# Patient Record
Sex: Female | Born: 1995 | Race: Black or African American | Hispanic: No | Marital: Single | Smoking: Never smoker
Health system: Southern US, Community
[De-identification: ages and names within clinical notes are randomized; demographics above are authoritative.]

---
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
## 2017-04-21 ENCOUNTER — Emergency Department (HOSPITAL_COMMUNITY)
Admission: EM | Admit: 2017-04-21 | Discharge: 2017-04-22 | Disposition: A | Discharge status: Home / Self Care | Payer: Managed Care, Other (non HMO) | Attending: Emergency Medicine | Admitting: Emergency Medicine

## 2017-04-21 ENCOUNTER — Emergency Department (HOSPITAL_COMMUNITY): Payer: Managed Care, Other (non HMO)

## 2017-04-21 ENCOUNTER — Encounter (HOSPITAL_COMMUNITY): Payer: Self-pay

## 2017-04-21 DIAGNOSIS — R197 Diarrhea, unspecified: Secondary | ICD-10-CM | POA: Insufficient documentation

## 2017-04-21 DIAGNOSIS — R112 Nausea with vomiting, unspecified: Secondary | ICD-10-CM | POA: Diagnosis not present

## 2017-04-21 DIAGNOSIS — F419 Anxiety disorder, unspecified: Secondary | ICD-10-CM | POA: Insufficient documentation

## 2017-04-21 DIAGNOSIS — R079 Chest pain, unspecified: Secondary | ICD-10-CM

## 2017-04-21 DIAGNOSIS — R0602 Shortness of breath: Secondary | ICD-10-CM | POA: Diagnosis present

## 2017-04-21 LAB — COMPREHENSIVE METABOLIC PANEL
ALBUMIN: 4.3 g/dL (ref 3.5–5.0)
ALT: 16 U/L (ref 14–54)
ANION GAP: 11 (ref 5–15)
AST: 21 U/L (ref 15–41)
Alkaline Phosphatase: 75 U/L (ref 38–126)
BUN: 13 mg/dL (ref 6–20)
CHLORIDE: 105 mmol/L (ref 101–111)
CO2: 22 mmol/L (ref 22–32)
Calcium: 9.8 mg/dL (ref 8.9–10.3)
Creatinine, Ser: 0.71 mg/dL (ref 0.44–1.00)
GFR calc non Af Amer: >60 mL/min (ref >60)
Glucose, Bld: 99 mg/dL (ref 65–99)
Potassium: 4 mmol/L (ref 3.5–5.1)
SODIUM: 138 mmol/L (ref 135–145)
Total Bilirubin: 0.4 mg/dL (ref 0.3–1.2)
Total Protein: 8 g/dL (ref 6.5–8.1)

## 2017-04-21 LAB — URINALYSIS, ROUTINE W REFLEX MICROSCOPIC
Bilirubin Urine: NEGATIVE
Glucose, UA: NEGATIVE mg/dL
Ketones, ur: 80 mg/dL — AB
Leukocytes, UA: NEGATIVE
NITRITE: NEGATIVE
PROTEIN: 100 mg/dL — AB
Specific Gravity, Urine: 1.027 (ref 1.005–1.030)
pH: 6 (ref 5.0–8.0)

## 2017-04-21 LAB — CBC
HCT: 30.1 % — ABNORMAL LOW (ref 36.0–46.0)
HEMOGLOBIN: 9.4 g/dL — AB (ref 12.0–15.0)
MCH: 23.5 pg — AB (ref 26.0–34.0)
MCHC: 31.2 g/dL (ref 30.0–36.0)
MCV: 75.3 fL — AB (ref 78.0–100.0)
Platelets: 326 10*3/uL (ref 150–400)
RBC: 4 MIL/uL (ref 3.87–5.11)
RDW: 17.8 % — ABNORMAL HIGH (ref 11.5–15.5)
WBC: 6.7 10*3/uL (ref 4.0–10.5)

## 2017-04-21 LAB — LIPASE, BLOOD: LIPASE: 26 U/L (ref 11–51)

## 2017-04-21 LAB — I-STAT BETA HCG BLOOD, ED (MC, WL, AP ONLY)

## 2017-04-21 MED ORDER — PROCHLORPERAZINE EDISYLATE 5 MG/ML IJ SOLN
10.0000 mg | Freq: Once | INTRAMUSCULAR | Status: AC
  Administered 2017-04-21: 10 mg via INTRAVENOUS
  Filled 2017-04-21: qty 2
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
## 2017-04-21 NOTE — ED Notes (Signed)
Med given 

## 2017-04-21 NOTE — ED Provider Notes (Signed)
[facility] EMERGENCY DEPARTMENT Provider Note   CSN: [id number] Arrival date & time: 04/21/17  1840     History   Chief Complaint Chief Complaint  Patient presents with  . CP, SOB, abd. pain   HPI  Blood pressure (!) 145/97, pulse (!) 57, temperature 98.9 F (37.2 C), temperature source Oral, resp. rate 17, SpO2 100 %.  Kina Shiffman is a 21 y.o. female complaining of nausea vomiting diarrhea with epigastric abdominal pain she has shortness of breath, states that it may be secondary to anxiety, hyperventilating initially on arrival this is she has been taking unknown antibiotic but she has been unable to keep this down.  She does not report any significant flank pain or fever chills or sick contacts. . Patient with no history of DVT/PE, recent immobilizations, calf pain, leg swelling.  Does not take any hormonal birth control.  History reviewed. No pertinent past medical history.  There are no active problems to display for this patient.   History reviewed. No pertinent surgical history.  OB History    No data available       Home Medications    Prior to Admission medications   Medication Sig Start Date End Date Taking? Authorizing Provider  hydrOXYzine (ATARAX/VISTARIL) 25 MG tablet Take 1 tablet (25 mg total) by mouth every 6 (six) hours. 04/22/17   Efton Thomley, Joni Reining, PA-C  ibuprofen (ADVIL,MOTRIN) 800 MG tablet Take 1 tablet (800 mg total) by mouth 3 (three) times daily. 04/29/15   Fayrene Helper, PA-C  methocarbamol (ROBAXIN) 500 MG tablet Take 1 tablet (500 mg total) by mouth 2 (two) times daily. 04/29/15   Fayrene Helper, PA-C  prochlorperazine (COMPAZINE) 10 MG tablet Take 1 tablet (10 mg total) by mouth 2 (two) times daily as needed for nausea or vomiting (Nausea ). 04/22/17   Cherylene Ferrufino, Mardella Layman    Family History No family history on file.  Social History Social History  Substance Use Topics  . Smoking status: Never Smoker  . Smokeless  tobacco: Not on file  . Alcohol use No     Allergies   Patient has no known allergies.   Review of Systems Review of Systems  A complete review of systems was obtained and all systems are negative except as noted in the HPI and PMH.   Physical Exam Updated Vital Signs BP (!) 145/97   Pulse (!) 57   Temp 98.9 F (37.2 C) (Oral)   Resp 17   SpO2 100%   Physical Exam  Constitutional: She is oriented to person, place, and time. She appears well-developed and well-nourished. No distress.  HENT:  Head: Normocephalic and atraumatic.  Mouth/Throat: Oropharynx is clear and moist.  Eyes: Pupils are equal, round, and reactive to light. Conjunctivae and EOM are normal.  Neck: Normal range of motion. No JVD present. No tracheal deviation present.  Cardiovascular: Normal rate, regular rhythm and intact distal pulses.   Radial pulse equal bilaterally  Pulmonary/Chest: Effort normal and breath sounds normal. No stridor. No respiratory distress. She has no wheezes. She has no rales. She exhibits no tenderness.  Abdominal: Soft. She exhibits no distension and no mass. There is no tenderness. There is no rebound and no guarding.  Musculoskeletal: Normal range of motion. She exhibits no edema or tenderness.  No calf asymmetry, superficial collaterals, palpable cords, edema, Homans sign negative bilaterally.    Neurological: She is alert and oriented to person, place, and time.  Skin: Skin is warm. She is not  diaphoretic.  Psychiatric: She has a normal mood and affect.  Nursing note and vitals reviewed.    ED Treatments / Results  Labs (all labs ordered are listed, but only abnormal results are displayed) Labs Reviewed  CBC - Abnormal; Notable for the following:       Result Value   Hemoglobin 9.4 (*)    HCT 30.1 (*)    MCV 75.3 (*)    MCH 23.5 (*)    RDW 17.8 (*)    All other components within normal limits  URINALYSIS, ROUTINE W REFLEX MICROSCOPIC - Abnormal; Notable for the  following:    APPearance HAZY (*)    Hgb urine dipstick SMALL (*)    Ketones, ur 80 (*)    Protein, ur 100 (*)    Bacteria, UA RARE (*)    Squamous Epithelial / LPF 0-5 (*)    All other components within normal limits  LIPASE, BLOOD  COMPREHENSIVE METABOLIC PANEL  I-STAT BETA HCG BLOOD, ED ([facility], [facility], [facility] ONLY)    EKG  EKG Interpretation None       Radiology Dg Abd Acute W/chest  Result Date: 04/21/2017 CLINICAL DATA:  Abdominal pain with vomiting EXAM: DG ABDOMEN ACUTE W/ 1V CHEST COMPARISON:  None. FINDINGS: There is no evidence of dilated bowel loops or free intraperitoneal air. No radiopaque calculi or other significant radiographic abnormality is seen. Heart size and mediastinal contours are within normal limits. Both lungs are clear. Scoliosis of the spine. Nipple and navel rings. Probable calcified phleboliths in the left pelvis. IMPRESSION: Negative abdominal radiographs.  No acute cardiopulmonary disease. Electronically Signed   By: Jasmine Pang M.D.   On: 04/21/2017 23:07    Procedures Procedures (including critical care time)  Medications Ordered in ED Medications  prochlorperazine (COMPAZINE) injection 10 mg (10 mg Intravenous Given 04/21/17 2347)     Initial Impression / Assessment and Plan / ED Course  I have reviewed the triage vital signs and the nursing notes.  Pertinent labs & imaging results that were available during my care of the patient were reviewed by me and considered in my medical decision making (see chart for details).     Vitals:   04/21/17 2140 04/21/17 2200 04/21/17 2230 04/21/17 2300  BP: (!) 155/82 139/89 (!) 159/95 (!) 145/97  Pulse: (!) 59 (!) 52 (!) 53 (!) 57  Resp: 14 18 20 17   Temp: 98.9 F (37.2 C)     TempSrc: Oral     SpO2: 100% 100% 100% 100%    Medications  prochlorperazine (COMPAZINE) injection 10 mg (10 mg Intravenous Given 04/21/17 2347)    Kirstine Jacquin is 21 y.o. female presenting with nausea vomiting diarrhea  chest pain and shortness of breath.  Abdominal exam is benign.  This is likely a viral gastroenteritis with superimposed anxiety.  Blood work was reassuring.  Acute abdominal series normal.  Abdominal exam is benign, she is tolerating p.o.'s.  Will give Compazine for nausea and Atarax for anxiety.  Evaluation does not show pathology that would require ongoing emergent intervention or inpatient treatment. Pt is hemodynamically stable and mentating appropriately. Discussed findings and plan with patient/guardian, who agrees with care plan. All questions answered. Return precautions discussed and outpatient follow up given.    Final Clinical Impressions(s) / ED Diagnoses   Final diagnoses:  SOB (shortness of breath)  Nausea vomiting and diarrhea  Chest pain, unspecified type  Shortness of breath  Anxiety    New Prescriptions New Prescriptions  HYDROXYZINE (ATARAX/VISTARIL) 25 MG TABLET    Take 1 tablet (25 mg total) by mouth every 6 (six) hours.   PROCHLORPERAZINE (COMPAZINE) 10 MG TABLET    Take 1 tablet (10 mg total) by mouth 2 (two) times daily as needed for nausea or vomiting (Nausea ).     Kaylyn Limisciotta, Mette [place]gate, PA-C 04/22/17 [id number]    Doug SouJacubowitz, Sam, MD 04/25/17 [phone]

## 2017-04-21 NOTE — ED Triage Notes (Signed)
Patient complains of abd. Pain with vomiting that started early yesterday am. Now complains of CP and shortness of breath. Hyperventilating and anxious on arrival. States seen in [facility] yesterday and diagnosed with UTI. Hyperventilating improving with coaching

## 2017-04-22 ENCOUNTER — Telehealth: Payer: Self-pay | Admitting: *Deleted

## 2017-04-22 MED ORDER — HYDROXYZINE HCL 25 MG PO TABS: 25.0000 mg | ORAL_TABLET | Freq: Four times a day (QID) | ORAL | 0 refills | Status: AC

## 2017-04-22 MED ORDER — PROCHLORPERAZINE MALEATE 10 MG PO TABS
10.0000 mg | ORAL_TABLET | Freq: Two times a day (BID) | ORAL | 0 refills | Status: AC | PRN
Start: 2017-04-22 — End: ?

## 2017-04-22 NOTE — Telephone Encounter (Signed)
Pharmacy called related to Rx: Atarax and Compazine being prescribed at same time as they are in same class.  Pharmacy wanted to know name of attending to speak to .Marland Kitchen.Marland Kitchen.EDCM searched chart to find Courtney Pope was attending.  EDCM read note from PA that explained reasoning for medications (nausea and anxiety) and relayed to Pharmacy.

## 2017-04-22 NOTE — Discharge Instructions (Signed)
Do not hesitate to return to the emergency room for any new, worsening or concerning symptoms.  Please obtain primary care using resource guide below. Let them know that you were seen in the emergency room and that they will need to obtain records for further outpatient management.  Please follow with your primary care doctor in the next 5 days for high blood pressure evaluation. If you do not have a primary care doctor, present to urgent care. Reduce salt intake. Seek emergency medical care for unilateral weakness, slurring, change in vision, or chest pain and shortness of breath.

## 2017-07-29 ENCOUNTER — Other Ambulatory Visit: Payer: Self-pay

## 2017-07-29 ENCOUNTER — Ambulatory Visit (HOSPITAL_COMMUNITY)
Admission: EM | Admit: 2017-07-29 | Discharge: 2017-07-29 | Disposition: A | Discharge status: Home / Self Care | Payer: Managed Care, Other (non HMO) | Attending: Internal Medicine | Admitting: Internal Medicine

## 2017-07-29 ENCOUNTER — Encounter (HOSPITAL_COMMUNITY): Payer: Self-pay | Admitting: Emergency Medicine

## 2017-07-29 DIAGNOSIS — F12188 Cannabis abuse with other cannabis-induced disorder: Secondary | ICD-10-CM

## 2017-07-29 DIAGNOSIS — E860 Dehydration: Secondary | ICD-10-CM

## 2017-07-29 DIAGNOSIS — Z3202 Encounter for pregnancy test, result negative: Secondary | ICD-10-CM

## 2017-07-29 DIAGNOSIS — F12988 Cannabis use, unspecified with other cannabis-induced disorder: Principal | ICD-10-CM

## 2017-07-29 DIAGNOSIS — R112 Nausea with vomiting, unspecified: Secondary | ICD-10-CM | POA: Diagnosis not present

## 2017-07-29 DIAGNOSIS — F1290 Cannabis use, unspecified, uncomplicated: Secondary | ICD-10-CM

## 2017-07-29 LAB — POCT PREGNANCY, URINE: Preg Test, Ur: NEGATIVE

## 2017-07-29 MED ORDER — ONDANSETRON HCL 4 MG/2ML IJ SOLN: 4.0000 mg | Freq: Once | INTRAMUSCULAR | Status: DC

## 2017-07-29 MED ORDER — KETOROLAC TROMETHAMINE 60 MG/2ML IM SOLN
60.0000 mg | Freq: Once | INTRAMUSCULAR | Status: AC
  Administered 2017-07-29: 60 mg via INTRAMUSCULAR

## 2017-07-29 MED ORDER — ONDANSETRON HCL 4 MG/2ML IJ SOLN
4.0000 mg | Freq: Once | INTRAMUSCULAR | Status: AC
  Administered 2017-07-29: 4 mg via INTRAVENOUS

## 2017-07-29 MED ORDER — ONDANSETRON HCL 4 MG/2ML IJ SOLN
INTRAMUSCULAR | Status: AC
  Filled 2017-07-29: qty 2

## 2017-07-29 MED ORDER — ONDANSETRON 4 MG PO TBDP
ORAL_TABLET | ORAL | Status: AC
  Filled 2017-07-29: qty 1

## 2017-07-29 MED ORDER — SODIUM CHLORIDE 0.9 % IV SOLN
Freq: Once | INTRAVENOUS | Status: AC
  Administered 2017-07-29: 21:00:00 via INTRAVENOUS

## 2017-07-29 MED ORDER — ONDANSETRON 4 MG PO TBDP
4.0000 mg | ORAL_TABLET | Freq: Once | ORAL | Status: AC
  Administered 2017-07-29: 4 mg via ORAL

## 2017-07-29 MED ORDER — KETOROLAC TROMETHAMINE 60 MG/2ML IM SOLN
INTRAMUSCULAR | Status: AC
  Filled 2017-07-29: qty 2

## 2017-07-29 MED ORDER — ONDANSETRON 8 MG PO TBDP: 8.0000 mg | ORAL_TABLET | Freq: Three times a day (TID) | ORAL | 0 refills | Status: AC | PRN

## 2017-07-29 NOTE — ED Provider Notes (Addendum)
  MRN: [id number] DOB: 11/14/1995  Subjective:   Courtney Pope is a 22 y.o. female presenting for 2 day history of nausea and vomiting. Has tried Zofran but she is vomiting this as well. She has not been able to tolerate fluids or solids. Smokes marijuana 4 times weekly. Prior to smoking marijuana consistently, patient denies having cyclic vomiting episodes. Denies cigarettes smoking. Patient is sexually active, uses condoms for protection. She is currently on her cycle. Denies fever, dysuria, hematuria, genital rashes, cough.   Courtney Pope is not currently taking any medications and has No Known Allergies.  Courtney Pope denies past medical and surgical history.   Objective:   Vitals: BP (!) 152/89   Pulse 75   Temp 98.6 F (37 C)   Resp 18   LMP 07/29/2017   SpO2 100%   Physical Exam  Constitutional: She is oriented to person, place, and time. She appears well-developed and well-nourished.  HENT:  Mucous membranes dry.  Eyes: No scleral icterus.  Cardiovascular: Normal rate, regular rhythm and intact distal pulses. Exam reveals no gallop and no friction rub.  No murmur heard. Pulmonary/Chest: No respiratory distress. She has no wheezes. She has no rales.  Abdominal: Soft. Bowel sounds are normal. She exhibits no distension and no mass. There is tenderness (left-sided). There is no guarding.  Neurological: She is alert and oriented to person, place, and time.  Skin: Skin is warm and dry.   Urine pregnancy test was negative.   Assessment and Plan :   Cannabinoid hyperemesis syndrome (HCC)  Non-intractable vomiting with nausea, unspecified vomiting type  Dehydration  Marijuana use  Counseled on risks of using marijuana regularly. IV fluids administered. Patient provided script for Zofran. Return-to-clinic precautions discussed, patient verbalized understanding.    Wallis BambergMani, Jeanann Balinski, [place]PA-C 07/29/17 2152

## 2017-07-29 NOTE — ED Notes (Signed)
Patient states that she is feeling better. Denies any nausea at this time. Patient has called for ride home.

## 2017-07-29 NOTE — ED Triage Notes (Signed)
Pt states "I have cyclic vomiting syndrome and I was prescribed medicine but I cant keep it down." Pt was prescribed zofran.

## 2019-02-16 IMAGING — CR DG ABDOMEN ACUTE W/ 1V CHEST
3 series · 3 of 3 positions shown · non-contrast
Comparison: None.

CLINICAL DATA: Abdominal pain with vomiting

EXAM:
DG ABDOMEN ACUTE W/ 1V CHEST

[chest pa]
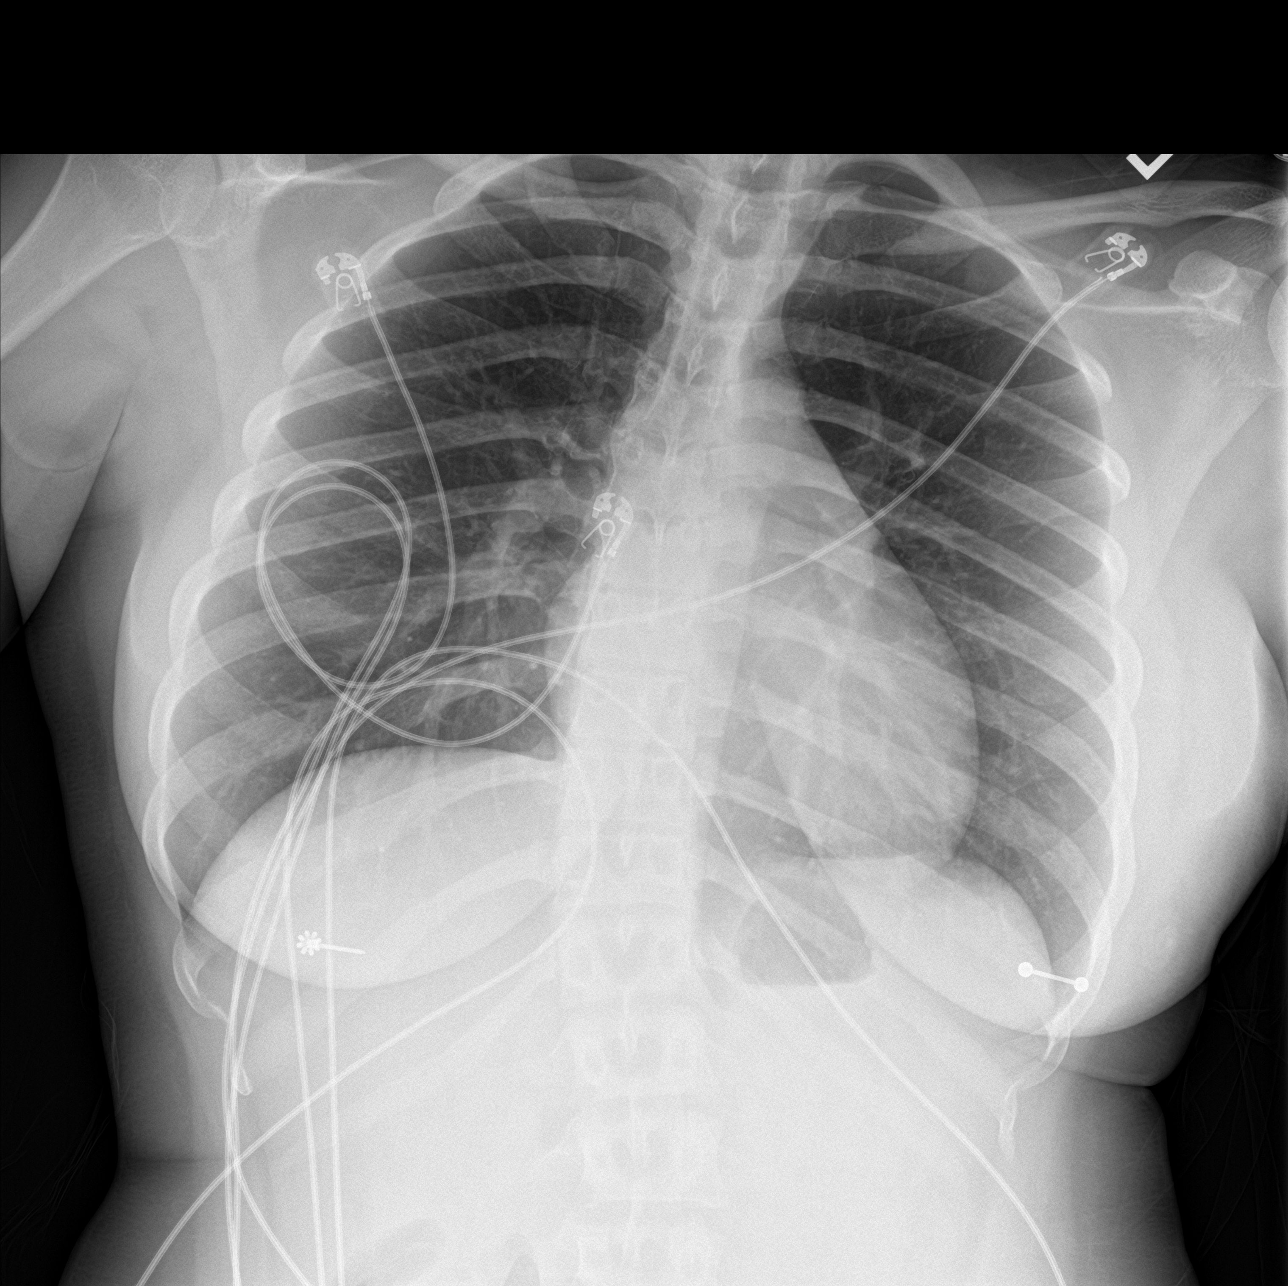

[abdomen erect]
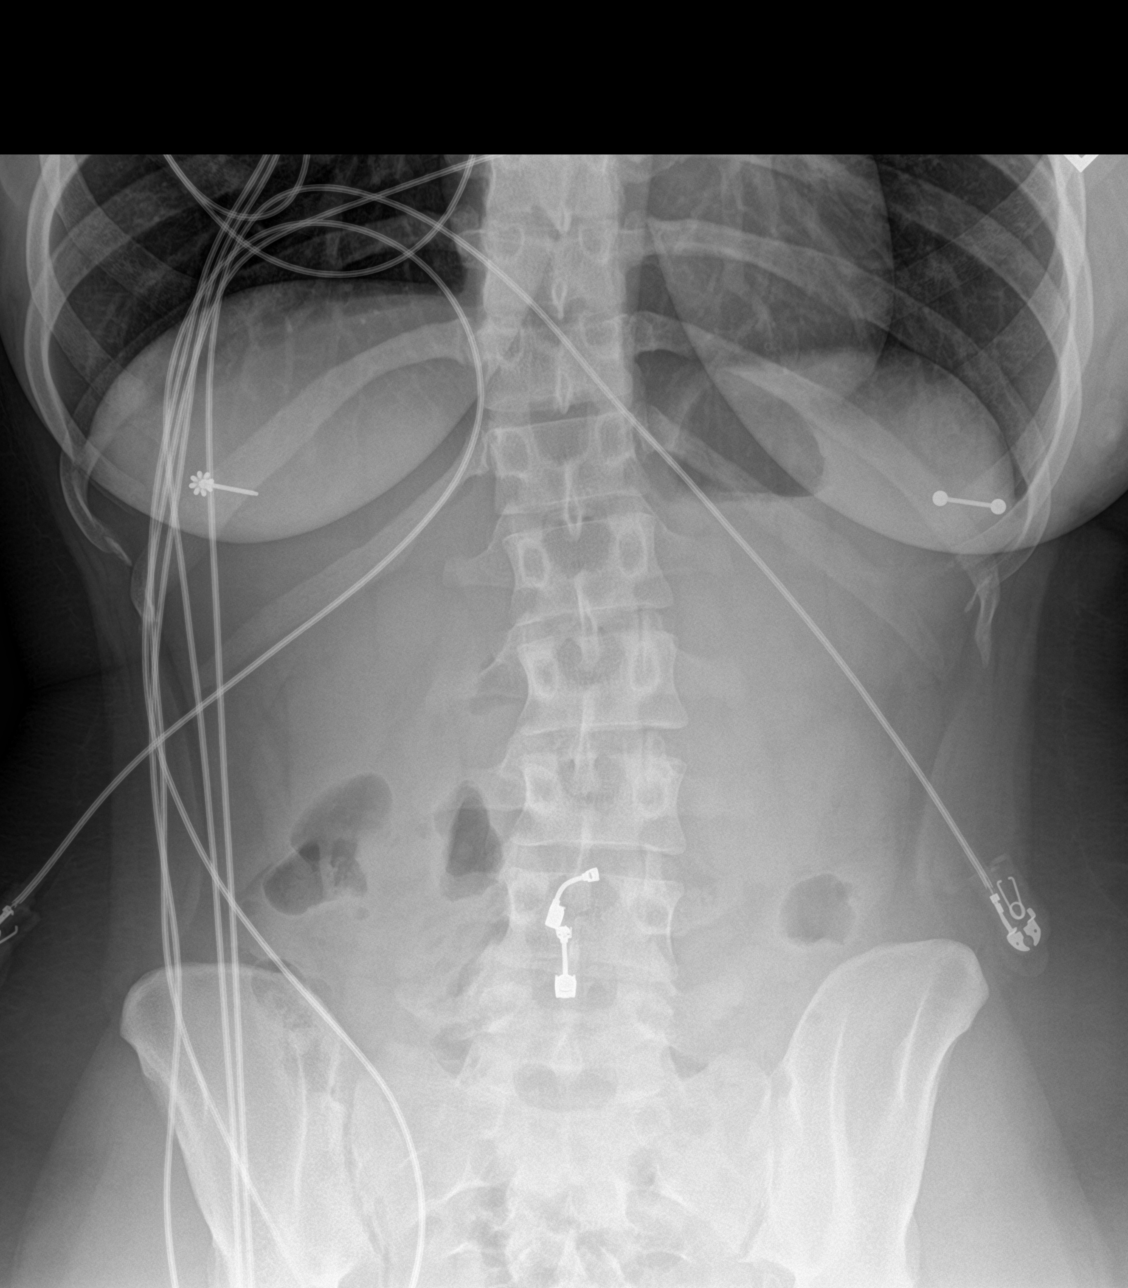

[abdomen supine]
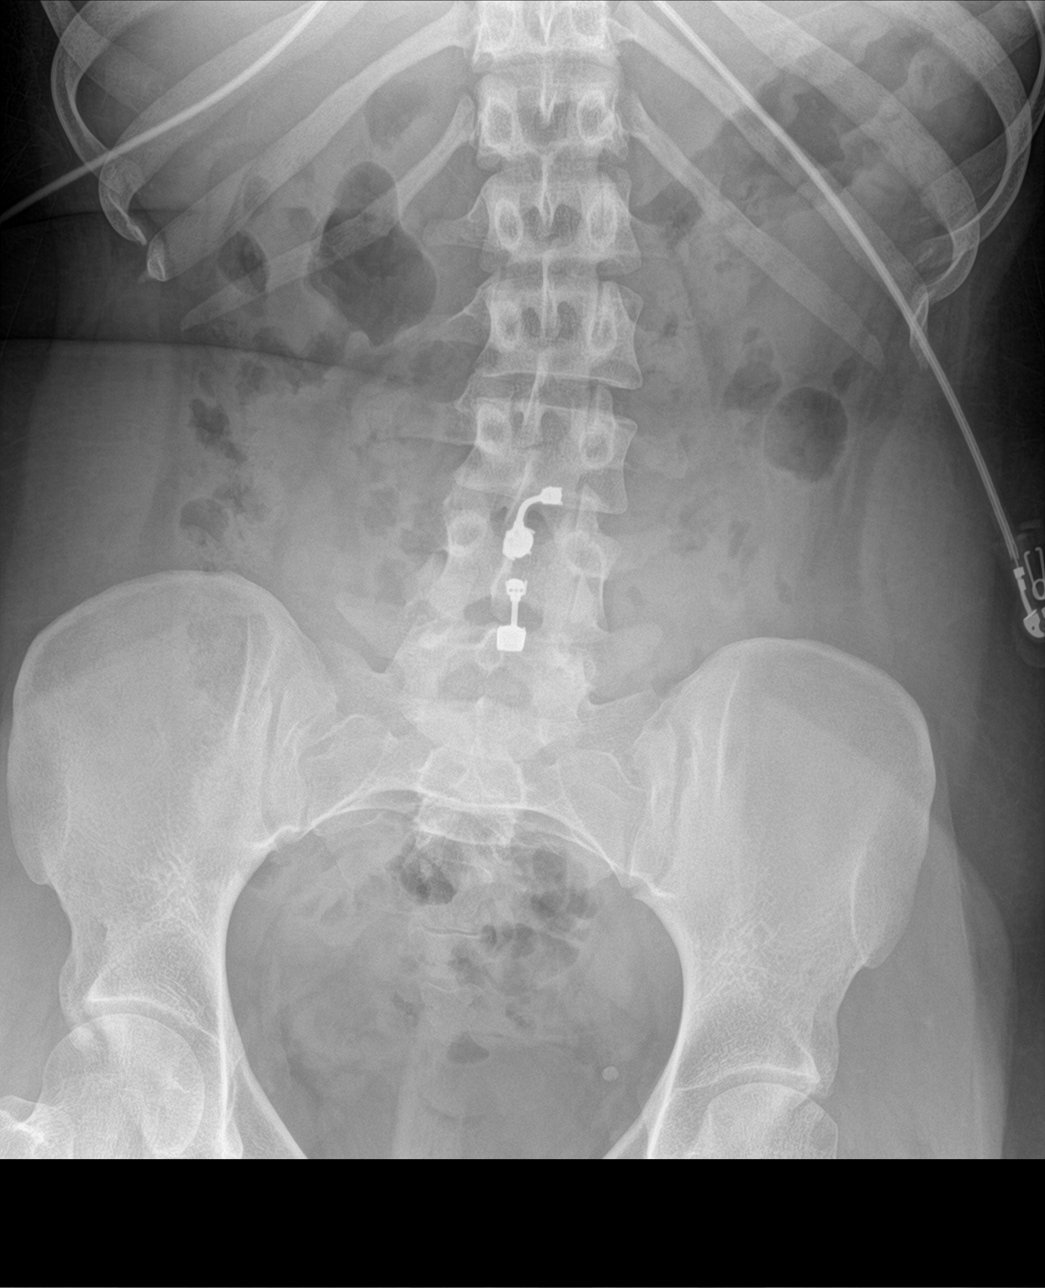

[3 of 3 positions shown; findings below may reference images not displayed]

FINDINGS: There is no evidence of dilated bowel loops or free intraperitoneal
air. No radiopaque calculi or other significant radiographic
abnormality is seen. Heart size and mediastinal contours are within
normal limits. Both lungs are clear. Scoliosis of the spine. Nipple
and navel rings. Probable calcified phleboliths in the left pelvis.
IMPRESSION: Negative abdominal radiographs.  No acute cardiopulmonary disease.
